# Patient Record
Sex: Male | Born: 2000 | Hispanic: No | Marital: Single | Smoking: Current every day smoker
Health system: Southern US, Community
[De-identification: ages and names within clinical notes are randomized; demographics above are authoritative.]

## PROBLEM LIST (undated history)

## (undated) DIAGNOSIS — W3400XA Accidental discharge from unspecified firearms or gun, initial encounter: Secondary | ICD-10-CM

---
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
## 2021-01-14 ENCOUNTER — Emergency Department (HOSPITAL_COMMUNITY): Payer: Medicaid Other | Admitting: Certified Registered Nurse Anesthetist

## 2021-01-14 ENCOUNTER — Encounter (HOSPITAL_COMMUNITY): Admission: EM | Disposition: A | Discharge status: Home / Self Care | Payer: Self-pay | Source: Home / Self Care

## 2021-01-14 ENCOUNTER — Emergency Department (HOSPITAL_BASED_OUTPATIENT_CLINIC_OR_DEPARTMENT_OTHER): Payer: Medicaid Other

## 2021-01-14 ENCOUNTER — Emergency Department (HOSPITAL_COMMUNITY): Payer: Medicaid Other

## 2021-01-14 ENCOUNTER — Inpatient Hospital Stay (HOSPITAL_BASED_OUTPATIENT_CLINIC_OR_DEPARTMENT_OTHER)
Admission: EM | Admit: 2021-01-14 | Discharge: 2021-01-18 | DRG: 957 | Disposition: A | Discharge status: Home / Self Care | Payer: Medicaid Other | Source: Ambulatory Visit | Attending: Surgery | Admitting: Surgery

## 2021-01-14 DIAGNOSIS — D62 Acute posthemorrhagic anemia: Secondary | ICD-10-CM | POA: Diagnosis present

## 2021-01-14 DIAGNOSIS — Z0189 Encounter for other specified special examinations: Secondary | ICD-10-CM

## 2021-01-14 DIAGNOSIS — Z9889 Other specified postprocedural states: Secondary | ICD-10-CM

## 2021-01-14 DIAGNOSIS — J90 Pleural effusion, not elsewhere classified: Secondary | ICD-10-CM

## 2021-01-14 DIAGNOSIS — S21132A Puncture wound without foreign body of left front wall of thorax without penetration into thoracic cavity, initial encounter: Secondary | ICD-10-CM | POA: Diagnosis present

## 2021-01-14 DIAGNOSIS — I959 Hypotension, unspecified: Secondary | ICD-10-CM | POA: Diagnosis present

## 2021-01-14 DIAGNOSIS — Z20822 Contact with and (suspected) exposure to covid-19: Secondary | ICD-10-CM | POA: Diagnosis present

## 2021-01-14 DIAGNOSIS — T07XXXA Unspecified multiple injuries, initial encounter: Secondary | ICD-10-CM

## 2021-01-14 DIAGNOSIS — J9601 Acute respiratory failure with hypoxia: Secondary | ICD-10-CM | POA: Diagnosis present

## 2021-01-14 DIAGNOSIS — E669 Obesity, unspecified: Secondary | ICD-10-CM | POA: Diagnosis present

## 2021-01-14 DIAGNOSIS — S27321A Contusion of lung, unilateral, initial encounter: Secondary | ICD-10-CM | POA: Diagnosis present

## 2021-01-14 DIAGNOSIS — E876 Hypokalemia: Secondary | ICD-10-CM | POA: Diagnosis not present

## 2021-01-14 DIAGNOSIS — W3400XA Accidental discharge from unspecified firearms or gun, initial encounter: Secondary | ICD-10-CM

## 2021-01-14 DIAGNOSIS — S71132A Puncture wound without foreign body, left thigh, initial encounter: Secondary | ICD-10-CM | POA: Diagnosis present

## 2021-01-14 DIAGNOSIS — Y929 Unspecified place or not applicable: Secondary | ICD-10-CM

## 2021-01-14 DIAGNOSIS — Z6832 Body mass index (BMI) 32.0-32.9, adult: Secondary | ICD-10-CM

## 2021-01-14 DIAGNOSIS — Z23 Encounter for immunization: Secondary | ICD-10-CM

## 2021-01-14 DIAGNOSIS — S31139A Puncture wound of abdominal wall without foreign body, unspecified quadrant without penetration into peritoneal cavity, initial encounter: Secondary | ICD-10-CM | POA: Diagnosis present

## 2021-01-14 DIAGNOSIS — S36032A Major laceration of spleen, initial encounter: Principal | ICD-10-CM | POA: Diagnosis present

## 2021-01-14 DIAGNOSIS — S31823A Puncture wound without foreign body of left buttock, initial encounter: Secondary | ICD-10-CM | POA: Diagnosis present

## 2021-01-14 DIAGNOSIS — T1490XA Injury, unspecified, initial encounter: Secondary | ICD-10-CM

## 2021-01-14 DIAGNOSIS — K661 Hemoperitoneum: Secondary | ICD-10-CM | POA: Diagnosis present

## 2021-01-14 DIAGNOSIS — S2242XB Multiple fractures of ribs, left side, initial encounter for open fracture: Secondary | ICD-10-CM | POA: Diagnosis present

## 2021-01-14 DIAGNOSIS — S27803A Laceration of diaphragm, initial encounter: Secondary | ICD-10-CM | POA: Diagnosis present

## 2021-01-14 DIAGNOSIS — S36039A Unspecified laceration of spleen, initial encounter: Secondary | ICD-10-CM

## 2021-01-14 DIAGNOSIS — S71131A Puncture wound without foreign body, right thigh, initial encounter: Secondary | ICD-10-CM | POA: Diagnosis present

## 2021-01-14 HISTORY — PX: LAPAROTOMY: SHX154

## 2021-01-14 HISTORY — PX: SPLENECTOMY, TOTAL: SHX788

## 2021-01-14 LAB — CBC WITH DIFFERENTIAL/PLATELET
Abs Immature Granulocytes: 0.04 10*3/uL (ref 0.00–0.07)
Basophils Absolute: 0 10*3/uL (ref 0.0–0.1)
Basophils Relative: 0 %
Eosinophils Absolute: 0 10*3/uL (ref 0.0–0.5)
Eosinophils Relative: 0 %
HCT: 41.7 % (ref 39.0–52.0)
Hemoglobin: 13.5 g/dL (ref 13.0–17.0)
Immature Granulocytes: 0 %
Lymphocytes Relative: 58 %
Lymphs Abs: 5.2 10*3/uL — ABNORMAL HIGH (ref 0.7–4.0)
MCH: 26.4 pg (ref 26.0–34.0)
MCHC: 32.4 g/dL (ref 30.0–36.0)
MCV: 81.4 fL (ref 80.0–100.0)
Monocytes Absolute: 1 10*3/uL (ref 0.1–1.0)
Monocytes Relative: 11 %
Neutro Abs: 2.9 10*3/uL (ref 1.7–7.7)
Neutrophils Relative %: 31 %
Platelets: 252 10*3/uL (ref 150–400)
RBC: 5.12 MIL/uL (ref 4.22–5.81)
RDW: 13.2 % (ref 11.5–15.5)
WBC: 9.2 10*3/uL (ref 4.0–10.5)
nRBC: 0 % (ref 0.0–0.2)

## 2021-01-14 SURGERY — SPLENECTOMY
Anesthesia: General | Site: Abdomen

## 2021-01-14 MED ORDER — FENTANYL CITRATE PF 50 MCG/ML IJ SOSY: 100.0000 ug | PREFILLED_SYRINGE | Freq: Once | INTRAMUSCULAR | Status: AC

## 2021-01-14 MED ORDER — ONDANSETRON 4 MG PO TBDP: 4.0000 mg | ORAL_TABLET | Freq: Once | ORAL | Status: DC

## 2021-01-14 MED ORDER — FENTANYL CITRATE PF 50 MCG/ML IJ SOSY
PREFILLED_SYRINGE | INTRAMUSCULAR | Status: AC
  Administered 2021-01-14: 100 ug via INTRAVENOUS
  Filled 2021-01-14: qty 1

## 2021-01-14 MED ORDER — FENTANYL CITRATE (PF) 250 MCG/5ML IJ SOLN
INTRAMUSCULAR | Status: AC
  Filled 2021-01-14: qty 5

## 2021-01-14 MED ORDER — IOHEXOL 350 MG/ML SOLN
100.0000 mL | Freq: Once | INTRAVENOUS | Status: AC | PRN
  Administered 2021-01-14: 100 mL via INTRAVENOUS

## 2021-01-14 MED ORDER — ONDANSETRON HCL 4 MG/2ML IJ SOLN
INTRAMUSCULAR | Status: AC
  Administered 2021-01-14: 4 mg
  Filled 2021-01-14: qty 2

## 2021-01-14 MED ORDER — PROPOFOL 10 MG/ML IV BOLUS
INTRAVENOUS | Status: AC
  Filled 2021-01-14: qty 20

## 2021-01-14 MED ORDER — CEFAZOLIN SODIUM-DEXTROSE 2-4 GM/100ML-% IV SOLN
2.0000 g | Freq: Once | INTRAVENOUS | Status: AC
  Administered 2021-01-14: 2 g via INTRAVENOUS

## 2021-01-14 MED ORDER — TETANUS-DIPHTH-ACELL PERTUSSIS 5-2.5-18.5 LF-MCG/0.5 IM SUSY
0.5000 mL | PREFILLED_SYRINGE | Freq: Once | INTRAMUSCULAR | Status: AC
  Administered 2021-01-14: 0.5 mL via INTRAMUSCULAR

## 2021-01-14 MED ORDER — SODIUM CHLORIDE 0.9 % IV SOLN
INTRAVENOUS | Status: AC | PRN
  Administered 2021-01-14: 1000 mL via INTRAVENOUS

## 2021-01-14 SURGICAL SUPPLY — 52 items
APL PRP STRL LF DISP 70% ISPRP (MISCELLANEOUS)
BAG COUNTER SPONGE SURGICOUNT (BAG) ×2 IMPLANT
BAG SPNG CNTER NS LX DISP (BAG) ×1
BLADE CLIPPER SURG (BLADE) ×1 IMPLANT
BNDG GAUZE ELAST 4 BULKY (GAUZE/BANDAGES/DRESSINGS) ×2 IMPLANT
CANISTER SUCT 3000ML PPV (MISCELLANEOUS) ×2 IMPLANT
CHLORAPREP W/TINT 26 (MISCELLANEOUS) ×1 IMPLANT
COVER SURGICAL LIGHT HANDLE (MISCELLANEOUS) ×2 IMPLANT
DRAPE LAPAROSCOPIC ABDOMINAL (DRAPES) ×2 IMPLANT
DRAPE WARM FLUID 44X44 (DRAPES) ×2 IMPLANT
ELECT BLADE 6.5 EXT (BLADE) IMPLANT
ELECT REM PT RETURN 9FT ADLT (ELECTROSURGICAL) ×2
ELECTRODE REM PT RTRN 9FT ADLT (ELECTROSURGICAL) ×1 IMPLANT
GAUZE SPONGE 4X4 12PLY STRL (GAUZE/BANDAGES/DRESSINGS) ×4 IMPLANT
GLOVE BIO SURGEON STRL SZ7 (GLOVE) ×2 IMPLANT
GLOVE BIOGEL PI IND STRL 7.5 (GLOVE) IMPLANT
GLOVE BIOGEL PI INDICATOR 7.5 (GLOVE) ×1
GLOVE OPTIFIT SS 7.5 STRL LX (GLOVE) ×2 IMPLANT
GLOVE OPTIFIT SS 8.0 STRL (GLOVE) ×1 IMPLANT
GLOVE SURG ENC MOIS LTX SZ7.5 (GLOVE) ×2 IMPLANT
GLOVE SURG UNDER LTX SZ8 (GLOVE) ×2 IMPLANT
GOWN STRL REUS W/ TWL LRG LVL3 (GOWN DISPOSABLE) ×1 IMPLANT
GOWN STRL REUS W/ TWL XL LVL3 (GOWN DISPOSABLE) ×1 IMPLANT
GOWN STRL REUS W/TWL LRG LVL3 (GOWN DISPOSABLE) ×2
GOWN STRL REUS W/TWL XL LVL3 (GOWN DISPOSABLE) ×2
HANDLE SUCTION POOLE (INSTRUMENTS) ×1 IMPLANT
KIT BASIN OR (CUSTOM PROCEDURE TRAY) ×2 IMPLANT
KIT TURNOVER KIT B (KITS) ×2 IMPLANT
LIGASURE IMPACT 36 18CM CVD LR (INSTRUMENTS) ×1 IMPLANT
NS IRRIG 1000ML POUR BTL (IV SOLUTION) ×6 IMPLANT
PACK GENERAL/GYN (CUSTOM PROCEDURE TRAY) ×2 IMPLANT
PAD ARMBOARD 7.5X6 YLW CONV (MISCELLANEOUS) ×2 IMPLANT
PENCIL SMOKE EVACUATOR (MISCELLANEOUS) ×2 IMPLANT
RELOAD STAPLE 60 2.6 WHT THN (STAPLE) IMPLANT
RELOAD STAPLER WHITE 60MM (STAPLE) ×4 IMPLANT
SOL PREP PROV IODINE SCRUB 4OZ (MISCELLANEOUS) ×1 IMPLANT
SPECIMEN JAR LARGE (MISCELLANEOUS) IMPLANT
SPONGE T-LAP 18X18 ~~LOC~~+RFID (SPONGE) ×7 IMPLANT
STAPLE ECHEON FLEX 60 POW ENDO (STAPLE) ×1 IMPLANT
STAPLER RELOAD WHITE 60MM (STAPLE) ×8
STAPLER VISISTAT 35W (STAPLE) ×2 IMPLANT
SUCTION POOLE HANDLE (INSTRUMENTS) ×2
SUT ETHILON 2 0 FS 18 (SUTURE) ×1 IMPLANT
SUT PDS AB 0 CT1 27 (SUTURE) ×1 IMPLANT
SUT PDS AB 1 TP1 96 (SUTURE) ×4 IMPLANT
SUT SILK 2 0 SH CR/8 (SUTURE) ×2 IMPLANT
SUT SILK 2 0 TIES 10X30 (SUTURE) ×2 IMPLANT
SUT SILK 3 0 SH CR/8 (SUTURE) ×2 IMPLANT
SUT SILK 3 0 TIES 10X30 (SUTURE) ×2 IMPLANT
TOWEL GREEN STERILE (TOWEL DISPOSABLE) ×2 IMPLANT
TRAY FOLEY MTR SLVR 16FR STAT (SET/KITS/TRAYS/PACK) ×2 IMPLANT
YANKAUER SUCT BULB TIP NO VENT (SUCTIONS) ×1 IMPLANT

## 2021-01-14 NOTE — ED Notes (Signed)
[name] in OR to call when ready for patient.

## 2021-01-14 NOTE — ED Notes (Signed)
Pt on stretcher-moaning when moved-pt made aware by EDP that he is being transferred to [facility] ED

## 2021-01-14 NOTE — ED Notes (Signed)
[facility] here for transport 
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
## 2021-01-14 NOTE — ED Notes (Signed)
Returned from ct 

## 2021-01-14 NOTE — ED Notes (Signed)
Labs ordered unable to be drawn d/t infusing blood products on arrival to the ED.

## 2021-01-14 NOTE — ED Notes (Signed)
Or is ready

## 2021-01-14 NOTE — ED Notes (Signed)
Asked pt if he would like [place] to call and updated, at this time, he says his "girl knows" and does not request any further status update

## 2021-01-14 NOTE — ED Notes (Signed)
Radiology in for xrays

## 2021-01-14 NOTE — ED Notes (Signed)
Pt given fentanyl 100mg  and zofran 4mg  IV-pt has PRBC x 2 units infusing
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
## 2021-01-15 ENCOUNTER — Encounter (HOSPITAL_COMMUNITY): Payer: Self-pay | Admitting: Surgery

## 2021-01-15 ENCOUNTER — Inpatient Hospital Stay (HOSPITAL_COMMUNITY): Payer: Medicaid Other

## 2021-01-15 DIAGNOSIS — S36039A Unspecified laceration of spleen, initial encounter: Secondary | ICD-10-CM | POA: Diagnosis present

## 2021-01-15 DIAGNOSIS — W3400XA Accidental discharge from unspecified firearms or gun, initial encounter: Secondary | ICD-10-CM

## 2021-01-15 DIAGNOSIS — S2242XB Multiple fractures of ribs, left side, initial encounter for open fracture: Secondary | ICD-10-CM | POA: Diagnosis present

## 2021-01-15 DIAGNOSIS — S31823A Puncture wound without foreign body of left buttock, initial encounter: Secondary | ICD-10-CM | POA: Diagnosis present

## 2021-01-15 DIAGNOSIS — E669 Obesity, unspecified: Secondary | ICD-10-CM | POA: Diagnosis present

## 2021-01-15 DIAGNOSIS — S21132A Puncture wound without foreign body of left front wall of thorax without penetration into thoracic cavity, initial encounter: Secondary | ICD-10-CM | POA: Diagnosis present

## 2021-01-15 DIAGNOSIS — S27803A Laceration of diaphragm, initial encounter: Secondary | ICD-10-CM | POA: Diagnosis present

## 2021-01-15 DIAGNOSIS — E876 Hypokalemia: Secondary | ICD-10-CM | POA: Diagnosis not present

## 2021-01-15 DIAGNOSIS — Z20822 Contact with and (suspected) exposure to covid-19: Secondary | ICD-10-CM | POA: Diagnosis present

## 2021-01-15 DIAGNOSIS — D62 Acute posthemorrhagic anemia: Secondary | ICD-10-CM | POA: Diagnosis present

## 2021-01-15 DIAGNOSIS — S31139A Puncture wound of abdominal wall without foreign body, unspecified quadrant without penetration into peritoneal cavity, initial encounter: Secondary | ICD-10-CM | POA: Diagnosis present

## 2021-01-15 DIAGNOSIS — Z9889 Other specified postprocedural states: Secondary | ICD-10-CM

## 2021-01-15 DIAGNOSIS — I959 Hypotension, unspecified: Secondary | ICD-10-CM | POA: Diagnosis present

## 2021-01-15 DIAGNOSIS — J9601 Acute respiratory failure with hypoxia: Secondary | ICD-10-CM | POA: Diagnosis present

## 2021-01-15 DIAGNOSIS — S71132A Puncture wound without foreign body, left thigh, initial encounter: Secondary | ICD-10-CM | POA: Diagnosis present

## 2021-01-15 DIAGNOSIS — Z23 Encounter for immunization: Secondary | ICD-10-CM | POA: Diagnosis not present

## 2021-01-15 DIAGNOSIS — K661 Hemoperitoneum: Secondary | ICD-10-CM | POA: Diagnosis present

## 2021-01-15 DIAGNOSIS — S27321A Contusion of lung, unilateral, initial encounter: Secondary | ICD-10-CM | POA: Diagnosis present

## 2021-01-15 DIAGNOSIS — Y929 Unspecified place or not applicable: Secondary | ICD-10-CM | POA: Diagnosis not present

## 2021-01-15 DIAGNOSIS — Z6832 Body mass index (BMI) 32.0-32.9, adult: Secondary | ICD-10-CM | POA: Diagnosis not present

## 2021-01-15 DIAGNOSIS — S71131A Puncture wound without foreign body, right thigh, initial encounter: Secondary | ICD-10-CM | POA: Diagnosis present

## 2021-01-15 DIAGNOSIS — S36032A Major laceration of spleen, initial encounter: Secondary | ICD-10-CM | POA: Diagnosis present

## 2021-01-15 LAB — URINALYSIS, ROUTINE W REFLEX MICROSCOPIC
Bacteria, UA: NONE SEEN
Bilirubin Urine: NEGATIVE
Glucose, UA: NEGATIVE mg/dL
Hgb urine dipstick: NEGATIVE
Ketones, ur: 80 mg/dL — AB
Leukocytes,Ua: NEGATIVE
Nitrite: NEGATIVE
Protein, ur: 30 mg/dL — AB
Specific Gravity, Urine: >1.046 — ABNORMAL HIGH (ref 1.005–1.030)
pH: 5 (ref 5.0–8.0)

## 2021-01-15 LAB — CBC
HCT: 38.5 % — ABNORMAL LOW (ref 39.0–52.0)
HCT: 38.3 % — ABNORMAL LOW (ref 39.0–52.0)
Hemoglobin: 13.2 g/dL (ref 13.0–17.0)
Hemoglobin: 13 g/dL (ref 13.0–17.0)
MCH: 27.6 pg (ref 26.0–34.0)
MCH: 27.4 pg (ref 26.0–34.0)
MCHC: 34.3 g/dL (ref 30.0–36.0)
MCHC: 33.9 g/dL (ref 30.0–36.0)
MCV: 80.4 fL (ref 80.0–100.0)
MCV: 80.6 fL (ref 80.0–100.0)
Platelets: 265 10*3/uL (ref 150–400)
Platelets: 278 10*3/uL (ref 150–400)
RBC: 4.79 MIL/uL (ref 4.22–5.81)
RBC: 4.75 MIL/uL (ref 4.22–5.81)
RDW: 13.8 % (ref 11.5–15.5)
RDW: 13.9 % (ref 11.5–15.5)
WBC: 10.9 10*3/uL — ABNORMAL HIGH (ref 4.0–10.5)
WBC: 15 10*3/uL — ABNORMAL HIGH (ref 4.0–10.5)
nRBC: 0 % (ref 0.0–0.2)
nRBC: 0 % (ref 0.0–0.2)

## 2021-01-15 LAB — BPAM RBC
Blood Product Expiration Date: 202210122359
Blood Product Expiration Date: 202210132359
ISSUE DATE / TIME: 202209262223
ISSUE DATE / TIME: 202209262223
Unit Type and Rh: 9500
Unit Type and Rh: 9500

## 2021-01-15 LAB — BASIC METABOLIC PANEL
Anion gap: 11 (ref 5–15)
BUN: 10 mg/dL (ref 6–20)
CO2: 21 mmol/L — ABNORMAL LOW (ref 22–32)
Calcium: 8 mg/dL — ABNORMAL LOW (ref 8.9–10.3)
Chloride: 103 mmol/L (ref 98–111)
Creatinine, Ser: 0.92 mg/dL (ref 0.61–1.24)
GFR, Estimated: >60 mL/min (ref >60)
Glucose, Bld: 151 mg/dL — ABNORMAL HIGH (ref 70–99)
Potassium: 3.9 mmol/L (ref 3.5–5.1)
Sodium: 135 mmol/L (ref 135–145)

## 2021-01-15 LAB — BLOOD PRODUCT ORDER (VERBAL) VERIFICATION

## 2021-01-15 LAB — COMPREHENSIVE METABOLIC PANEL
ALT: 25 U/L (ref 0–44)
AST: 32 U/L (ref 15–41)
Albumin: 3.9 g/dL (ref 3.5–5.0)
Alkaline Phosphatase: 91 U/L (ref 38–126)
Anion gap: 23 — ABNORMAL HIGH (ref 5–15)
BUN: 12 mg/dL (ref 6–20)
CO2: 14 mmol/L — ABNORMAL LOW (ref 22–32)
Calcium: 8.5 mg/dL — ABNORMAL LOW (ref 8.9–10.3)
Chloride: 98 mmol/L (ref 98–111)
Creatinine, Ser: 1.41 mg/dL — ABNORMAL HIGH (ref 0.61–1.24)
GFR, Estimated: >60 mL/min (ref >60)
Glucose, Bld: 236 mg/dL — ABNORMAL HIGH (ref 70–99)
Potassium: 2.6 mmol/L — CL (ref 3.5–5.1)
Sodium: 135 mmol/L (ref 135–145)
Total Bilirubin: 0.4 mg/dL (ref 0.3–1.2)
Total Protein: 7.5 g/dL (ref 6.5–8.1)

## 2021-01-15 LAB — RESP PANEL BY RT-PCR (FLU A&B, COVID) ARPGX2
Influenza A by PCR: NEGATIVE
Influenza B by PCR: NEGATIVE
SARS Coronavirus 2 by RT PCR: NEGATIVE

## 2021-01-15 LAB — POCT I-STAT 7, (LYTES, BLD GAS, ICA,H+H)
Acid-base deficit: 2 mmol/L (ref 0.0–2.0)
Bicarbonate: 25.9 mmol/L (ref 20.0–28.0)
Calcium, Ion: 1.11 mmol/L — ABNORMAL LOW (ref 1.15–1.40)
HCT: 34 % — ABNORMAL LOW (ref 39.0–52.0)
Hemoglobin: 11.6 g/dL — ABNORMAL LOW (ref 13.0–17.0)
O2 Saturation: 100 %
Patient temperature: 35.9
Potassium: 3.4 mmol/L — ABNORMAL LOW (ref 3.5–5.1)
Sodium: 137 mmol/L (ref 135–145)
TCO2: 28 mmol/L (ref 22–32)
pCO2 arterial: 52.5 mmHg — ABNORMAL HIGH (ref 32.0–48.0)
pH, Arterial: 7.296 — ABNORMAL LOW (ref 7.350–7.450)
pO2, Arterial: 253 mmHg — ABNORMAL HIGH (ref 83.0–108.0)

## 2021-01-15 LAB — TYPE AND SCREEN
ABO/RH(D): A POS
Antibody Screen: NEGATIVE
Unit division: 0
Unit division: 0

## 2021-01-15 LAB — ETHANOL: Alcohol, Ethyl (B): <10 mg/dL (ref <10)

## 2021-01-15 LAB — HIV ANTIBODY (ROUTINE TESTING W REFLEX): HIV Screen 4th Generation wRfx: NONREACTIVE

## 2021-01-15 LAB — MRSA NEXT GEN BY PCR, NASAL: MRSA by PCR Next Gen: NOT DETECTED

## 2021-01-15 LAB — PREPARE RBC (CROSSMATCH)

## 2021-01-15 LAB — ABO/RH: ABO/RH(D): A POS

## 2021-01-15 MED ORDER — FENTANYL CITRATE (PF) 250 MCG/5ML IJ SOLN
INTRAMUSCULAR | Status: DC | PRN
  Administered 2021-01-14: 50 ug via INTRAVENOUS
  Administered 2021-01-15 (×2): 100 ug via INTRAVENOUS

## 2021-01-15 MED ORDER — LIDOCAINE HCL (PF) 2 % IJ SOLN
INTRAMUSCULAR | Status: AC
  Filled 2021-01-15: qty 10

## 2021-01-15 MED ORDER — SUCCINYLCHOLINE CHLORIDE 200 MG/10ML IV SOSY
PREFILLED_SYRINGE | INTRAVENOUS | Status: AC
  Filled 2021-01-15: qty 10

## 2021-01-15 MED ORDER — ROCURONIUM BROMIDE 100 MG/10ML IV SOLN
INTRAVENOUS | Status: DC | PRN
  Administered 2021-01-15 (×3): 50 mg via INTRAVENOUS

## 2021-01-15 MED ORDER — CHLORHEXIDINE GLUCONATE CLOTH 2 % EX PADS
6.0000 | MEDICATED_PAD | Freq: Every day | CUTANEOUS | Status: DC
  Administered 2021-01-17: 6 via TOPICAL

## 2021-01-15 MED ORDER — PROPOFOL 500 MG/50ML IV EMUL
INTRAVENOUS | Status: DC | PRN
  Administered 2021-01-15: 50 ug/kg/min via INTRAVENOUS

## 2021-01-15 MED ORDER — LIDOCAINE 2% (20 MG/ML) 5 ML SYRINGE
INTRAMUSCULAR | Status: DC | PRN
  Administered 2021-01-14: 50 mg via INTRAVENOUS

## 2021-01-15 MED ORDER — DOCUSATE SODIUM 100 MG PO CAPS: 100.0000 mg | ORAL_CAPSULE | Freq: Two times a day (BID) | ORAL | Status: DC

## 2021-01-15 MED ORDER — DEXAMETHASONE SODIUM PHOSPHATE 10 MG/ML IJ SOLN
INTRAMUSCULAR | Status: AC
  Filled 2021-01-15: qty 1

## 2021-01-15 MED ORDER — FENTANYL BOLUS VIA INFUSION
50.0000 ug | INTRAVENOUS | Status: DC | PRN
  Administered 2021-01-15 (×3): 50 ug via INTRAVENOUS
  Administered 2021-01-15: 100 ug via INTRAVENOUS
  Filled 2021-01-15: qty 100

## 2021-01-15 MED ORDER — DOCUSATE SODIUM 50 MG/5ML PO LIQD: 100.0000 mg | Freq: Two times a day (BID) | ORAL | Status: DC

## 2021-01-15 MED ORDER — SUCCINYLCHOLINE CHLORIDE 200 MG/10ML IV SOSY
PREFILLED_SYRINGE | INTRAVENOUS | Status: DC | PRN
  Administered 2021-01-14: 140 mg via INTRAVENOUS

## 2021-01-15 MED ORDER — ROCURONIUM BROMIDE 10 MG/ML (PF) SYRINGE
PREFILLED_SYRINGE | INTRAVENOUS | Status: AC
  Filled 2021-01-15: qty 60

## 2021-01-15 MED ORDER — PHENYLEPHRINE 40 MCG/ML (10ML) SYRINGE FOR IV PUSH (FOR BLOOD PRESSURE SUPPORT)
PREFILLED_SYRINGE | INTRAVENOUS | Status: AC
  Filled 2021-01-15: qty 20

## 2021-01-15 MED ORDER — LACTATED RINGERS IV BOLUS
1000.0000 mL | Freq: Once | INTRAVENOUS | Status: AC
  Administered 2021-01-15: 1000 mL via INTRAVENOUS

## 2021-01-15 MED ORDER — FENTANYL CITRATE PF 50 MCG/ML IJ SOSY
50.0000 ug | PREFILLED_SYRINGE | INTRAMUSCULAR | Status: DC | PRN
Start: 2021-01-15 — End: 2021-01-16
  Administered 2021-01-15 (×4): 100 ug via INTRAVENOUS
  Administered 2021-01-15: 50 ug via INTRAVENOUS
  Administered 2021-01-16: 100 ug via INTRAVENOUS
  Filled 2021-01-15: qty 2
  Filled 2021-01-15: qty 1
  Filled 2021-01-15 (×4): qty 2

## 2021-01-15 MED ORDER — FENTANYL CITRATE PF 50 MCG/ML IJ SOSY
50.0000 ug | PREFILLED_SYRINGE | Freq: Once | INTRAMUSCULAR | Status: AC
  Administered 2021-01-15: 50 ug via INTRAVENOUS

## 2021-01-15 MED ORDER — HYDROMORPHONE HCL 1 MG/ML IJ SOLN
INTRAMUSCULAR | Status: AC
  Filled 2021-01-15: qty 0.5

## 2021-01-15 MED ORDER — ONDANSETRON HCL 4 MG/2ML IJ SOLN
4.0000 mg | Freq: Four times a day (QID) | INTRAMUSCULAR | Status: DC | PRN
  Administered 2021-01-15 – 2021-01-16 (×4): 4 mg via INTRAVENOUS
  Filled 2021-01-15 (×4): qty 2

## 2021-01-15 MED ORDER — LACTATED RINGERS IV SOLN: INTRAVENOUS | Status: DC | PRN

## 2021-01-15 MED ORDER — ONDANSETRON 4 MG PO TBDP
4.0000 mg | ORAL_TABLET | Freq: Four times a day (QID) | ORAL | Status: DC | PRN
  Administered 2021-01-15 – 2021-01-17 (×2): 4 mg via ORAL
  Filled 2021-01-15 (×2): qty 1

## 2021-01-15 MED ORDER — EPHEDRINE 5 MG/ML INJ
INTRAVENOUS | Status: AC
  Filled 2021-01-15: qty 10

## 2021-01-15 MED ORDER — PHENYLEPHRINE HCL-NACL 20-0.9 MG/250ML-% IV SOLN
INTRAVENOUS | Status: DC | PRN
  Administered 2021-01-15: 10 ug/min via INTRAVENOUS

## 2021-01-15 MED ORDER — ENOXAPARIN SODIUM 30 MG/0.3ML IJ SOSY
30.0000 mg | PREFILLED_SYRINGE | Freq: Two times a day (BID) | INTRAMUSCULAR | Status: DC
  Administered 2021-01-16 – 2021-01-18 (×5): 30 mg via SUBCUTANEOUS
  Filled 2021-01-15 (×5): qty 0.3

## 2021-01-15 MED ORDER — PROPOFOL 1000 MG/100ML IV EMUL
0.0000 ug/kg/min | INTRAVENOUS | Status: DC
Start: 2021-01-15 — End: 2021-01-15
  Administered 2021-01-15: 40 ug/kg/min via INTRAVENOUS
  Filled 2021-01-15 (×2): qty 100

## 2021-01-15 MED ORDER — ONDANSETRON HCL 4 MG/2ML IJ SOLN
INTRAMUSCULAR | Status: AC
  Filled 2021-01-15: qty 4

## 2021-01-15 MED ORDER — FENTANYL 2500MCG IN NS 250ML (10MCG/ML) PREMIX INFUSION
50.0000 ug/h | INTRAVENOUS | Status: DC
  Administered 2021-01-15: 50 ug/h via INTRAVENOUS
  Filled 2021-01-15: qty 250

## 2021-01-15 MED ORDER — ATROPINE SULFATE 0.4 MG/ML IJ SOLN
INTRAMUSCULAR | Status: AC
  Filled 2021-01-15: qty 2

## 2021-01-15 MED ORDER — POLYETHYLENE GLYCOL 3350 17 G PO PACK: 17.0000 g | PACK | Freq: Every day | ORAL | Status: DC

## 2021-01-15 MED ORDER — PHENYLEPHRINE HCL (PRESSORS) 10 MG/ML IV SOLN
INTRAVENOUS | Status: DC | PRN
  Administered 2021-01-15 (×3): 80 ug via INTRAVENOUS

## 2021-01-15 MED ORDER — 0.9 % SODIUM CHLORIDE (POUR BTL) OPTIME
TOPICAL | Status: DC | PRN
  Administered 2021-01-15: 4000 mL

## 2021-01-15 MED ORDER — PROPOFOL 10 MG/ML IV BOLUS
INTRAVENOUS | Status: DC | PRN
Start: 2021-01-14 — End: 2021-01-15
  Administered 2021-01-14: 140 mg via INTRAVENOUS

## 2021-01-15 MED ORDER — HYDROMORPHONE HCL 1 MG/ML IJ SOLN
INTRAMUSCULAR | Status: DC | PRN
  Administered 2021-01-15: .5 mg via INTRAVENOUS

## 2021-01-15 MED ORDER — LACTATED RINGERS IV SOLN: INTRAVENOUS | Status: DC

## 2021-01-15 MED ORDER — OXYCODONE HCL 5 MG PO TABS
5.0000 mg | ORAL_TABLET | ORAL | Status: DC | PRN
  Administered 2021-01-15 – 2021-01-17 (×8): 10 mg via ORAL
  Filled 2021-01-15 (×8): qty 2

## 2021-01-15 NOTE — Progress Notes (Signed)
Patient ID: Mark Grimes, male   DOB: [birth year], 19 y.o.   MRN: [id number] RN reports he now wants to see his mother - update visitation.  Violeta Gelinas, MD, MPH, FACS Please use [email or web address] to contact on call provider

## 2021-01-15 NOTE — TOC CAGE-AID Note (Signed)
Transition of Care [facility]) - CAGE-AID Screening   Patient Details  Name: Mark Grimes: [id number] Date of Birth: 2001/01/02  Transition of Care [facility] CM/SW Contact:    Annelies Coyt C Tarpley-Carter, LCSWA Phone Number: 01/15/2021, 3:09 PM   Clinical Narrative: Pt participated in Cage-Aid.  Pt stated he does use substance (cannabis) and ETOH socially.  Neither effects his day to day life.  Pt was offered resources, and accepted.     Zakara Parkey Tarpley-Carter, MSW, LCSW-A Pronouns:  She/Her/Hers [facility] Clinical Social Worker Direct Number:  [phone] [email or web address]   CAGE-AID Screening:    Have You Ever Felt You Ought to Cut Down on Your Drinking or Drug Use?: No Have People Annoyed You By [employer] Your Drinking Or Drug Use?: No Have You Felt Bad Or Guilty About Your Drinking Or Drug Use?: No Have You Ever Had a Drink or Used Drugs First Thing In The Morning to Steady Your Nerves or to Get Rid of a Hangover?: No CAGE-AID Score: 0  Substance Abuse Education Offered: Yes  Substance abuse interventions: Transport planner
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
## 2021-01-15 NOTE — Anesthesia Procedure Notes (Addendum)
Arterial Line Insertion Start/End9/27/2022 12:03 AM, 01/15/2021 12:07 AM Performed by: Shelton Silvas, MD, Molli Hazard, CRNA, CRNA  Patient location: OR. Preanesthetic checklist: patient identified, IV checked, site marked, risks and benefits discussed, surgical consent, monitors and equipment checked, pre-op evaluation, timeout performed and anesthesia consent Left, radial was placed Catheter size: 20 G Hand hygiene performed  and maximum sterile barriers used   Attempts: 1 Procedure performed without using ultrasound guided technique. Following insertion, dressing applied and Biopatch. Post procedure assessment: normal and unchanged  Patient tolerated the procedure well with no immediate complications.

## 2021-01-15 NOTE — Progress Notes (Signed)
Patient ID: Mark Grimes, male   DOB: [birth year], 19 y.o.   MRN: [id number] He is doing well after extubation. I updated his mother at the bedside.  Violeta Gelinas, MD, MPH, FACS Please use [email or web address] to contact on call provider

## 2021-01-15 NOTE — Procedures (Signed)
Extubation Procedure Note  Patient Details:   Name: Mark Grimes DOB: 2000/10/19 MRN: [id number]   Airway Documentation:    Vent end date: 01/15/21 Vent end time: 0912   Evaluation  O2 sats: stable throughout Complications: No apparent complications Patient did tolerate procedure well. Bilateral Breath Sounds: Rhonchi   Yes  Pt extubated to 4L [place] per MD order. Pt had positive cuff leak, no stridor heard after. RN at bedside. RT will continue to monitor.   [name] 01/15/2021, 9:16 AM

## 2021-01-15 NOTE — Anesthesia Procedure Notes (Addendum)
Procedure Name: Intubation Date/Time: 01/14/2021 11:58 PM Performed by: Molli Hazard, CRNA Pre-anesthesia Checklist: Patient identified, Emergency Drugs available, Suction available and Patient being monitored Patient Re-evaluated:Patient Re-evaluated prior to induction Oxygen Delivery Method: Circle System Utilized Preoxygenation: Pre-oxygenation with 100% oxygen Induction Type: IV induction Laryngoscope Size: Miller and 2 Grade View: Grade I Tube type: Oral Tube size: 7.5 mm Number of attempts: 1 Airway Equipment and Method: Stylet and Oral airway Placement Confirmation: ETT inserted through vocal cords under direct vision, positive ETCO2 and breath sounds checked- equal and bilateral Secured at: 23 cm Tube secured with: Tape Dental Injury: Teeth and Oropharynx as per pre-operative assessment

## 2021-01-15 NOTE — Transfer of Care (Signed)
Immediate Anesthesia Transfer of Care Note  Patient: Mark Grimes  Procedure(s) Performed: EXPLORATORY LAPAROTOMY (Abdomen) SPLENECTOMY AND  REPAIR OF DIAPHRAGM (Abdomen)  Patient Location: NICU  Anesthesia Type:General  Level of Consciousness: Patient remains intubated per anesthesia plan  Airway & Oxygen Therapy: Patient placed on Ventilator (see vital sign flow sheet for setting)  Post-op Assessment: Report given to RN and Post -op Vital signs reviewed and stable  Post vital signs: Reviewed and stable  Last Vitals:  Vitals Value Taken Time  BP 146/109 01/15/21 0200  Temp    Pulse 103 01/15/21 0202  Resp 16 01/15/21 0202  SpO2 100 % 01/15/21 0202  Vitals shown include unvalidated device data.  Last Pain:  Vitals:   01/14/21 2308  TempSrc: Temporal         Complications: No notable events documented.
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
## 2021-01-15 NOTE — Progress Notes (Signed)
Orthopedic Tech Progress Note Patient Details:  [name] May 17, 2000 [id number]  Patient ID: Mark Grimes, male   DOB: 2000/06/24, 19 y.o.   MRN: [id number] Attended trauma page Trinna Post 01/15/2021, 1:26 AM

## 2021-01-15 NOTE — Anesthesia Preprocedure Evaluation (Addendum)
Anesthesia Evaluation  Patient identified by MRN, date of birth, ID band Patient awake    Reviewed: Allergy & Precautions, NPO status , Patient's Chart, lab work & pertinent test results, Unable to perform ROS - Chart review onlyPreop documentation limited or incomplete due to emergent nature of procedure.  Airway Mallampati: I  TM Distance: >3 FB Neck ROM: Full    Dental  (+) Teeth Intact, Dental Advisory Given   Pulmonary neg pulmonary ROS,    breath sounds clear to auscultation       Cardiovascular negative cardio ROS   Rhythm:Regular Rate:Tachycardia     Neuro/Psych negative neurological ROS  negative psych ROS   GI/Hepatic negative GI ROS, Neg liver ROS,   Endo/Other  negative endocrine ROS  Renal/GU negative Renal ROS     Musculoskeletal negative musculoskeletal ROS (+)   Abdominal (+)  Abdomen: tender.    Peds  Hematology negative hematology ROS (+)   Anesthesia Other Findings   Reproductive/Obstetrics                             Anesthesia Physical Anesthesia Plan  ASA: 3 and emergent  Anesthesia Plan: General   Post-op Pain Management:    Induction: Intravenous, Rapid sequence and Cricoid pressure planned  PONV Risk Score and Plan: 3 and Ondansetron, Dexamethasone and Midazolam  Airway Management Planned: Oral ETT  Additional Equipment: Arterial line  Intra-op Plan:   Post-operative Plan: Post-operative intubation/ventilation  Informed Consent: I have reviewed the patients History and Physical, chart, labs and discussed the procedure including the risks, benefits and alternatives for the proposed anesthesia with the patient or authorized representative who has indicated his/her understanding and acceptance.     Dental advisory given  Plan Discussed with: CRNA  Anesthesia Plan Comments: (Surgeon request the patient stay intubated after procedure. )         Anesthesia Quick Evaluation

## 2021-01-15 NOTE — ED Notes (Signed)
Pt family information updated in chart

## 2021-01-16 LAB — BASIC METABOLIC PANEL
Anion gap: 9 (ref 5–15)
BUN: <5 mg/dL — ABNORMAL LOW (ref 6–20)
CO2: 26 mmol/L (ref 22–32)
Calcium: 7.9 mg/dL — ABNORMAL LOW (ref 8.9–10.3)
Chloride: 99 mmol/L (ref 98–111)
Creatinine, Ser: 0.72 mg/dL (ref 0.61–1.24)
GFR, Estimated: >60 mL/min (ref >60)
Glucose, Bld: 120 mg/dL — ABNORMAL HIGH (ref 70–99)
Potassium: 3.3 mmol/L — ABNORMAL LOW (ref 3.5–5.1)
Sodium: 134 mmol/L — ABNORMAL LOW (ref 135–145)

## 2021-01-16 LAB — CBC
HCT: 28.8 % — ABNORMAL LOW (ref 39.0–52.0)
HCT: 29.6 % — ABNORMAL LOW (ref 39.0–52.0)
Hemoglobin: 9.7 g/dL — ABNORMAL LOW (ref 13.0–17.0)
Hemoglobin: 10 g/dL — ABNORMAL LOW (ref 13.0–17.0)
MCH: 27 pg (ref 26.0–34.0)
MCH: 27.3 pg (ref 26.0–34.0)
MCHC: 33.7 g/dL (ref 30.0–36.0)
MCHC: 33.8 g/dL (ref 30.0–36.0)
MCV: 80.2 fL (ref 80.0–100.0)
MCV: 80.9 fL (ref 80.0–100.0)
Platelets: 278 10*3/uL (ref 150–400)
Platelets: 313 10*3/uL (ref 150–400)
RBC: 3.59 MIL/uL — ABNORMAL LOW (ref 4.22–5.81)
RBC: 3.66 MIL/uL — ABNORMAL LOW (ref 4.22–5.81)
RDW: 13.8 % (ref 11.5–15.5)
RDW: 13.8 % (ref 11.5–15.5)
WBC: 13.5 10*3/uL — ABNORMAL HIGH (ref 4.0–10.5)
WBC: 14.8 10*3/uL — ABNORMAL HIGH (ref 4.0–10.5)
nRBC: 0 % (ref 0.0–0.2)
nRBC: 0 % (ref 0.0–0.2)

## 2021-01-16 LAB — TRIGLYCERIDES: Triglycerides: 65 mg/dL (ref <150)

## 2021-01-16 LAB — SURGICAL PATHOLOGY

## 2021-01-16 MED ORDER — METHOCARBAMOL 500 MG PO TABS
500.0000 mg | ORAL_TABLET | Freq: Three times a day (TID) | ORAL | Status: DC
  Administered 2021-01-16 – 2021-01-18 (×6): 500 mg via ORAL
  Filled 2021-01-16 (×6): qty 1

## 2021-01-16 MED ORDER — POTASSIUM CHLORIDE 20 MEQ PO PACK
20.0000 meq | PACK | Freq: Once | ORAL | Status: AC
  Administered 2021-01-16: 20 meq via ORAL
  Filled 2021-01-16: qty 1

## 2021-01-16 MED ORDER — HYDROMORPHONE HCL 1 MG/ML IJ SOLN
1.0000 mg | INTRAMUSCULAR | Status: DC | PRN
Start: 2021-01-16 — End: 2021-01-18

## 2021-01-16 MED ORDER — METHOCARBAMOL 1000 MG/10ML IJ SOLN
500.0000 mg | Freq: Three times a day (TID) | INTRAVENOUS | Status: DC
  Administered 2021-01-16: 500 mg via INTRAVENOUS
  Filled 2021-01-16: qty 5

## 2021-01-16 MED ORDER — POLYETHYLENE GLYCOL 3350 17 G PO PACK
17.0000 g | PACK | Freq: Every day | ORAL | Status: DC
  Administered 2021-01-17 – 2021-01-18 (×2): 17 g via ORAL
  Filled 2021-01-16 (×2): qty 1

## 2021-01-16 MED ORDER — WHITE PETROLATUM EX OINT
TOPICAL_OINTMENT | CUTANEOUS | Status: AC
  Administered 2021-01-16: 0.2
  Filled 2021-01-16: qty 28.35

## 2021-01-16 MED ORDER — GABAPENTIN 300 MG PO CAPS
300.0000 mg | ORAL_CAPSULE | Freq: Three times a day (TID) | ORAL | Status: DC
  Administered 2021-01-16 – 2021-01-18 (×7): 300 mg via ORAL
  Filled 2021-01-16 (×7): qty 1

## 2021-01-16 MED ORDER — OXYCODONE HCL 5 MG PO TABS
10.0000 mg | ORAL_TABLET | Freq: Once | ORAL | Status: AC
Start: 2021-01-16 — End: 2021-01-16
  Administered 2021-01-16: 10 mg via ORAL
  Filled 2021-01-16: qty 2

## 2021-01-16 MED ORDER — ACETAMINOPHEN 500 MG PO TABS
1000.0000 mg | ORAL_TABLET | Freq: Four times a day (QID) | ORAL | Status: DC
  Administered 2021-01-16 – 2021-01-17 (×5): 1000 mg via ORAL
  Filled 2021-01-16 (×4): qty 2
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
## 2021-01-16 NOTE — Anesthesia Postprocedure Evaluation (Signed)
Anesthesia Post Note  Patient: Mark Grimes  Procedure(s) Performed: EXPLORATORY LAPAROTOMY (Abdomen) SPLENECTOMY AND  REPAIR OF DIAPHRAGM (Abdomen)     Patient location during evaluation: SICU Anesthesia Type: General Level of consciousness: sedated Pain management: pain level controlled Vital Signs Assessment: post-procedure vital signs reviewed and stable Respiratory status: patient remains intubated per anesthesia plan Cardiovascular status: stable Postop Assessment: no apparent nausea or vomiting Anesthetic complications: no   No notable events documented.  Last Vitals:  Vitals:   01/16/21 0500 01/16/21 0600  BP: 121/78 (!) 152/83  Pulse: 80 87  Resp: 13 (!) 24  Temp:    SpO2: 96% 100%    Last Pain:  Vitals:   01/16/21 0400  TempSrc: Oral  PainSc:                  Shelton Silvas
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
## 2021-01-17 ENCOUNTER — Inpatient Hospital Stay (HOSPITAL_COMMUNITY): Payer: Medicaid Other

## 2021-01-17 ENCOUNTER — Encounter (HOSPITAL_COMMUNITY): Payer: Self-pay

## 2021-01-17 ENCOUNTER — Other Ambulatory Visit: Payer: Self-pay

## 2021-01-17 LAB — BASIC METABOLIC PANEL
Anion gap: 13 (ref 5–15)
BUN: <5 mg/dL — ABNORMAL LOW (ref 6–20)
CO2: 24 mmol/L (ref 22–32)
Calcium: 8 mg/dL — ABNORMAL LOW (ref 8.9–10.3)
Chloride: 94 mmol/L — ABNORMAL LOW (ref 98–111)
Creatinine, Ser: 0.73 mg/dL (ref 0.61–1.24)
GFR, Estimated: >60 mL/min (ref >60)
Glucose, Bld: 112 mg/dL — ABNORMAL HIGH (ref 70–99)
Potassium: 3.2 mmol/L — ABNORMAL LOW (ref 3.5–5.1)
Sodium: 131 mmol/L — ABNORMAL LOW (ref 135–145)

## 2021-01-17 LAB — CBC
HCT: 27.2 % — ABNORMAL LOW (ref 39.0–52.0)
Hemoglobin: 8.9 g/dL — ABNORMAL LOW (ref 13.0–17.0)
MCH: 26.6 pg (ref 26.0–34.0)
MCHC: 32.7 g/dL (ref 30.0–36.0)
MCV: 81.2 fL (ref 80.0–100.0)
Platelets: 387 10*3/uL (ref 150–400)
RBC: 3.35 MIL/uL — ABNORMAL LOW (ref 4.22–5.81)
RDW: 13.9 % (ref 11.5–15.5)
WBC: 14.9 10*3/uL — ABNORMAL HIGH (ref 4.0–10.5)
nRBC: 0.1 % (ref 0.0–0.2)

## 2021-01-17 LAB — MAGNESIUM: Magnesium: 1.7 mg/dL (ref 1.7–2.4)

## 2021-01-17 MED ORDER — POTASSIUM CHLORIDE IN NACL 20-0.9 MEQ/L-% IV SOLN
INTRAVENOUS | Status: DC
  Filled 2021-01-17 (×2): qty 1000

## 2021-01-17 MED ORDER — SODIUM CHLORIDE 0.9 % IV SOLN: INTRAVENOUS | Status: DC

## 2021-01-17 MED ORDER — HYDROCODONE-ACETAMINOPHEN 5-325 MG PO TABS
1.0000 | ORAL_TABLET | Freq: Four times a day (QID) | ORAL | Status: DC | PRN
  Administered 2021-01-17: 1 via ORAL
  Administered 2021-01-18 (×2): 2 via ORAL
  Administered 2021-01-18: 1 via ORAL
  Filled 2021-01-17: qty 2
  Filled 2021-01-17: qty 1
  Filled 2021-01-17: qty 2
  Filled 2021-01-17: qty 1

## 2021-01-17 MED ORDER — POTASSIUM CHLORIDE 20 MEQ PO PACK
40.0000 meq | PACK | Freq: Once | ORAL | Status: AC
  Administered 2021-01-17: 40 meq via ORAL
  Filled 2021-01-17: qty 2

## 2021-01-17 MED ORDER — ORAL CARE MOUTH RINSE
15.0000 mL | Freq: Two times a day (BID) | OROMUCOSAL | Status: DC
  Administered 2021-01-17 – 2021-01-18 (×3): 15 mL via OROMUCOSAL
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
## 2021-01-18 ENCOUNTER — Encounter (HOSPITAL_COMMUNITY): Payer: Self-pay

## 2021-01-18 LAB — BASIC METABOLIC PANEL
Anion gap: 11 (ref 5–15)
BUN: <5 mg/dL — ABNORMAL LOW (ref 6–20)
CO2: 27 mmol/L (ref 22–32)
Calcium: 8.2 mg/dL — ABNORMAL LOW (ref 8.9–10.3)
Chloride: 98 mmol/L (ref 98–111)
Creatinine, Ser: 0.63 mg/dL (ref 0.61–1.24)
GFR, Estimated: >60 mL/min (ref >60)
Glucose, Bld: 111 mg/dL — ABNORMAL HIGH (ref 70–99)
Potassium: 3.1 mmol/L — ABNORMAL LOW (ref 3.5–5.1)
Sodium: 136 mmol/L (ref 135–145)

## 2021-01-18 LAB — TYPE AND SCREEN
ABO/RH(D): A POS
Antibody Screen: NEGATIVE
Unit division: 0
Unit division: 0

## 2021-01-18 LAB — CBC
HCT: 27.6 % — ABNORMAL LOW (ref 39.0–52.0)
Hemoglobin: 9.1 g/dL — ABNORMAL LOW (ref 13.0–17.0)
MCH: 26.8 pg (ref 26.0–34.0)
MCHC: 33 g/dL (ref 30.0–36.0)
MCV: 81.4 fL (ref 80.0–100.0)
Platelets: 495 10*3/uL — ABNORMAL HIGH (ref 150–400)
RBC: 3.39 MIL/uL — ABNORMAL LOW (ref 4.22–5.81)
RDW: 14 % (ref 11.5–15.5)
WBC: 12.6 10*3/uL — ABNORMAL HIGH (ref 4.0–10.5)
nRBC: 0.3 % — ABNORMAL HIGH (ref 0.0–0.2)

## 2021-01-18 LAB — BPAM RBC
Blood Product Expiration Date: 202210022359
Blood Product Expiration Date: 202210022359
Unit Type and Rh: 6200
Unit Type and Rh: 6200

## 2021-01-18 MED ORDER — MENINGOCOCCAL A C Y&W-135 OLIG IM SOLR
0.5000 mL | Freq: Once | INTRAMUSCULAR | Status: DC
  Filled 2021-01-18: qty 0.5

## 2021-01-18 MED ORDER — PNEUMOCOCCAL 13-VAL CONJ VACC IM SUSP
0.5000 mL | Freq: Once | INTRAMUSCULAR | Status: DC
  Filled 2021-01-18: qty 0.5

## 2021-01-18 MED ORDER — HYDROCODONE-ACETAMINOPHEN 5-325 MG PO TABS: 1.0000 | ORAL_TABLET | Freq: Four times a day (QID) | ORAL | 0 refills | Status: AC | PRN

## 2021-01-18 MED ORDER — HAEMOPHILUS B POLYSAC CONJ VAC IM SOLR
0.5000 mL | Freq: Once | INTRAMUSCULAR | Status: AC
  Administered 2021-01-18: 0.5 mL via INTRAMUSCULAR
  Filled 2021-01-18: qty 0.5

## 2021-01-18 MED ORDER — PNEUMOCOCCAL 13-VAL CONJ VACC IM SUSP
0.5000 mL | Freq: Once | INTRAMUSCULAR | Status: AC
  Administered 2021-01-18: 0.5 mL via INTRAMUSCULAR
  Filled 2021-01-18 (×2): qty 0.5

## 2021-01-18 MED ORDER — GABAPENTIN 300 MG PO CAPS: 300.0000 mg | ORAL_CAPSULE | Freq: Two times a day (BID) | ORAL | 0 refills | Status: DC | PRN

## 2021-01-18 MED ORDER — HAEMOPHILUS B POLYSAC CONJ VAC IM SOLR
0.5000 mL | Freq: Once | INTRAMUSCULAR | Status: DC
  Filled 2021-01-18: qty 0.5

## 2021-01-18 MED ORDER — METHOCARBAMOL 500 MG PO TABS: 500.0000 mg | ORAL_TABLET | Freq: Three times a day (TID) | ORAL | 0 refills | Status: DC | PRN

## 2021-01-18 MED ORDER — MENINGOCOCCAL A C Y&W-135 OLIG IM SOLR
0.5000 mL | Freq: Once | INTRAMUSCULAR | Status: AC
  Administered 2021-01-18: 0.5 mL via INTRAMUSCULAR
  Filled 2021-01-18 (×2): qty 0.5

## 2021-01-18 MED ORDER — PNEUMOCOCCAL 13-VAL CONJ VACC IM SUSP
0.5000 mL | INTRAMUSCULAR | Status: DC | PRN
  Filled 2021-01-18: qty 0.5

## 2021-01-18 MED ORDER — MENINGOCOCCAL A C Y&W-135 OLIG IM SOLR
0.5000 mL | INTRAMUSCULAR | Status: DC | PRN
  Filled 2021-01-18: qty 0.5

## 2021-01-18 MED ORDER — HAEMOPHILUS B POLYSAC CONJ VAC IM SOLR
0.5000 mL | INTRAMUSCULAR | Status: DC | PRN
  Filled 2021-01-18: qty 0.5
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
## 2021-01-18 NOTE — Discharge Instructions (Addendum)
[facility]      [facility], [place] [phone]  OPEN ABDOMINAL SURGERY: POST OP INSTRUCTIONS  Always review your discharge instruction sheet given to you by the facility where your surgery was performed.  IF YOU HAVE DISABILITY OR FAMILY LEAVE FORMS, YOU MUST BRING THEM TO THE OFFICE FOR PROCESSING.  PLEASE DO NOT GIVE THEM TO YOUR DOCTOR.  A prescription for pain medication may be given to you upon discharge.  Take your pain medication as prescribed, if needed.  If narcotic pain medicine is not needed, then you may take acetaminophen (Tylenol) or ibuprofen (Advil) as needed. Take your usually prescribed medications unless otherwise directed. If you need a refill on your pain medication, please contact your pharmacy. They will contact our office to request authorization.  Prescriptions will not be filled after 5pm or on week-ends. You should follow a light diet the first few days after arrival home, such as soup and crackers, pudding, etc.unless your doctor has advised otherwise. A high-fiber, low fat diet can be resumed as tolerated.   Be sure to include lots of fluids daily. Most patients will experience some swelling and bruising on the chest and neck area.  Ice packs will help.  Swelling and bruising can take several days to resolve Most patients will experience some swelling and bruising in the area of the incision. Ice pack will help. Swelling and bruising can take several days to resolve..  It is common to experience some constipation if taking pain medication after surgery.  Increasing fluid intake and taking a stool softener will usually help or prevent this problem from occurring.  A mild laxative (Milk of Magnesia or Miralax) should be taken according to package directions if there are no bowel movements after 48 hours.  You may have steri-strips (small skin tapes) in place directly over the incision.  These strips should be left on the skin for 7-10 days.  If your surgeon used skin  glue on the incision, you may shower in 24 hours.  The glue will flake off over the next 2-3 weeks.  Any sutures or staples will be removed at the office during your follow-up visit. You may find that a light gauze bandage over your incision may keep your staples from being rubbed or pulled. You may shower and replace the bandage daily. ACTIVITIES:  You may resume regular (light) daily activities beginning the next day--such as daily self-care, walking, climbing stairs--gradually increasing activities as tolerated.  You may have sexual intercourse when it is comfortable.  Refrain from any heavy lifting or straining until approved by your doctor. You may drive when you no longer are taking prescription pain medication, you can comfortably wear a seatbelt, and you can safely maneuver your car and apply brakes You should see your doctor in the office for a follow-up appointment approximately two weeks after your surgery.  Make sure that you call for this appointment within a day or two after you arrive home to insure a convenient appointment time.  WHEN TO CALL YOUR DOCTOR: Fever over 101.0 Inability to urinate Nausea and/or vomiting Extreme swelling or bruising Continued bleeding from incision. Increased pain, redness, or drainage from the incision. Difficulty swallowing or breathing Muscle cramping or spasms. Numbness or tingling in hands or feet or around lips.  The clinic staff is available to answer your questions during regular business hours.  Please don't hesitate to call and ask to speak to one of the nurses if you have concerns.  For further  questions, please visit [email or web address]   RIB FRACTURES  HOME INSTRUCTIONS   PAIN CONTROL:  Pain is best controlled by a usual combination of three different methods TOGETHER:  Ice/Heat Over the counter pain medication Prescription pain medication You may experience some swelling and bruising in area of broken ribs. Ice packs  or heating pads (30-60 minutes up to 6 times a day) will help. Use ice for the first few days to help decrease swelling and bruising, then switch to heat to help relax tight/sore spots and speed recovery. Some people prefer to use ice alone, heat alone, alternating between ice & heat. Experiment to what works for you. Swelling and bruising can take several weeks to resolve.  It is helpful to take an over-the-counter pain medication regularly for the first few weeks. Choose one of the following that works best for you:  Naproxen (Aleve, etc) Two 220mg  tabs twice a day Ibuprofen (Advil, etc) Three 200mg  tabs four times a day (every meal & bedtime) Acetaminophen (Tylenol, etc) 500-650mg  four times a day (every meal & bedtime) A prescription for pain medication (such as oxycodone, hydrocodone, etc) may be given to you upon discharge. Take your pain medication as prescribed.  If you are having problems/concerns with the prescription medicine (does not control pain, nausea, vomiting, rash, itching, etc), please call [phone] to see if we need to switch you to a different pain medicine that will work better for you and/or control your side effect better. If you need a refill on your pain medication, please contact your pharmacy. They will contact our office to request authorization. Prescriptions will not be filled after 5 pm or on week-ends. Avoid getting constipated. When taking pain medications, it is common to experience some constipation. Increasing fluid intake and taking a fiber supplement (such as Metamucil, Citrucel, FiberCon, MiraLax, etc) 1-2 times a day regularly will usually help prevent this problem from occurring. A mild laxative (prune juice, Milk of Magnesia, MiraLax, etc) should be taken according to package directions if there are no bowel movements after 48 hours.  Watch out for diarrhea. If you have many loose bowel movements, simplify your diet to bland foods & liquids for a few  days. Stop any stool softeners and decrease your fiber supplement. Switching to mild anti-diarrheal medications (Kayopectate, Pepto Bismol) can help. If this worsens or does not improve, please call us. FOLLOW UP  If a follow up appointment is needed one will be scheduled for you. If none is needed with our trauma team, please follow up with your primary care provider within 2-3 weeks from discharge. Please call [facility] at [phone] if you have any questions about follow up.  If you have any orthopedic or other injuries you will need to follow up as outlined in your follow up instructions.   WHEN TO CALL us [phone]:  Poor pain control Reactions / problems with new medications (rash/itching, nausea, etc)  Fever over 101.5 F (38.5 C) Worsening swelling or bruising Worsening pain, productive cough, difficulty breathing or any other concerning symptoms  The clinic staff is available to answer your questions during regular business hours (8:30am-5pm). Please don't hesitate to call and ask to speak to one of our nurses for clinical concerns.  If you have a medical emergency, go to the nearest emergency room or call 911.  A surgeon from [facility] is always on call at the [facility], [facility]  [address],  [place], [place] [address] ?  MAIN: [phone] ? TOLL FREE: [phone] ?  FAX [phone]  [email or web address]      Information on Rib Fractures  A rib fracture is a break or crack in one of the bones of the ribs. The ribs are long, curved bones that wrap around your chest and attach to your spine and your breastbone. The ribs protect your heart, lungs, and other organs in the chest. A broken or cracked rib is often painful but is not usually serious. Most rib fractures heal on their own over time. However, rib fractures can be more serious if multiple ribs are broken or if broken ribs move out of place and push  against other structures or organs. What are the causes? This condition is caused by: Repetitive movements with high force, such as pitching a baseball or having severe coughing spells. A direct blow to the chest, such as a sports injury, a car accident, or a fall. Cancer that has spread to the bones, which can weaken bones and cause them to break. What are the signs or symptoms? Symptoms of this condition include: Pain when you breathe in or cough. Pain when someone presses on the injured area. Feeling short of breath. How is this diagnosed? This condition is diagnosed with a physical exam and medical history. Imaging tests may also be done, such as: Chest X-ray. CT scan. MRI. Bone scan. Chest ultrasound. How is this treated? Treatment for this condition depends on the severity of the fracture. Most rib fractures usually heal on their own in 1-3 months. Sometimes healing takes longer if there is a cough that does not stop or if there are other activities that make the injury worse (aggravating factors). While you heal, you will be given medicines to control the pain. You will also be taught deep breathing exercises. Severe injuries may require hospitalization or surgery. Follow these instructions at home: Managing pain, stiffness, and swelling If directed, apply ice to the injured area. Put ice in a plastic bag. Place a towel between your skin and the bag. Leave the ice on for 20 minutes, 2-3 times a day. Take over-the-counter and prescription medicines only as told by your health care provider. Activity Avoid a lot of activity and any activities or movements that cause pain. Be careful during activities and avoid bumping the injured rib. Slowly increase your activity as told by your health care provider. General instructions Do deep breathing exercises as told by your health care provider. This helps prevent pneumonia, which is a common complication of a broken rib. Your health care  provider may instruct you to: Take deep breaths several times a day. Try to cough several times a day, holding a pillow against the injured area. Use a device called incentive spirometer to practice deep breathing several times a day. Drink enough fluid to keep your urine pale yellow. Do not wear a rib belt or binder. These restrict breathing, which can lead to pneumonia. Keep all follow-up visits as told by your health care provider. This is important. Contact a health care provider if: You have a fever. Get help right away if: You have difficulty breathing or you are short of breath. You develop a cough that does not stop, or you cough up thick or bloody sputum. You have nausea, vomiting, or pain in your abdomen. Your pain gets worse and medicine does not help. Summary A rib fracture is a break or crack in one of the bones of the  ribs. A broken or cracked rib is often painful but is not usually serious. Most rib fractures heal on their own over time. Treatment for this condition depends on the severity of the fracture. Avoid a lot of activity and any activities or movements that cause pain. This information is not intended to replace advice given to you by your health care provider. Make sure you discuss any questions you have with your health care provider. Document Released: 04/07/2005 Document Revised: 07/07/2016 Document Reviewed: 07/07/2016 Elsevier Interactive Patient Education  2019 [employer].

## 2021-01-18 NOTE — Progress Notes (Signed)
Elbert Ewings to be D/C'd Home per MD order.  Discussed with the patient and all questions fully answered.  VSS, Skin clean, dry and intact without evidence of skin break down, no evidence of skin tears noted. IV catheter discontinued intact. Site without signs and symptoms of complications. Dressing and pressure applied.  An After Visit Summary was printed and given to the patient. Patient received prescription.  D/c education completed with patient/family including follow up instructions, medication list, d/c activities limitations if indicated, with other d/c instructions as indicated by MD - patient able to verbalize understanding, all questions fully answered.   Patient instructed to return to ED, call 911, or call MD for any changes in condition.   Patient escorted via WC, and D/C home via private auto.    Conley Canal [facility] 01/18/2021 2:51 PM

## 2021-01-25 ENCOUNTER — Inpatient Hospital Stay: Payer: Medicaid Other | Admitting: Family

## 2021-01-31 ENCOUNTER — Ambulatory Visit (INDEPENDENT_AMBULATORY_CARE_PROVIDER_SITE_OTHER): Payer: Medicaid Other | Admitting: Nurse Practitioner

## 2021-01-31 ENCOUNTER — Other Ambulatory Visit: Payer: Self-pay

## 2021-01-31 VITALS — BP 128/80 | HR 94 | Resp 18

## 2021-01-31 DIAGNOSIS — W3400XA Accidental discharge from unspecified firearms or gun, initial encounter: Secondary | ICD-10-CM | POA: Diagnosis not present

## 2021-01-31 DIAGNOSIS — S2249XD Multiple fractures of ribs, unspecified side, subsequent encounter for fracture with routine healing: Secondary | ICD-10-CM | POA: Diagnosis not present

## 2021-01-31 DIAGNOSIS — Z9889 Other specified postprocedural states: Secondary | ICD-10-CM

## 2021-01-31 MED ORDER — GABAPENTIN 300 MG PO CAPS: 300.0000 mg | ORAL_CAPSULE | Freq: Two times a day (BID) | ORAL | 0 refills | Status: AC | PRN

## 2021-01-31 MED ORDER — METHOCARBAMOL 500 MG PO TABS: 500.0000 mg | ORAL_TABLET | Freq: Three times a day (TID) | ORAL | 0 refills | Status: AC | PRN
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
## 2021-02-06 ENCOUNTER — Encounter: Payer: Self-pay | Admitting: Nurse Practitioner

## 2021-02-06 DIAGNOSIS — S2249XD Multiple fractures of ribs, unspecified side, subsequent encounter for fracture with routine healing: Secondary | ICD-10-CM | POA: Insufficient documentation
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
## 2021-02-18 ENCOUNTER — Ambulatory Visit: Payer: Medicaid Other | Admitting: Family Medicine

## 2021-10-29 ENCOUNTER — Emergency Department (HOSPITAL_BASED_OUTPATIENT_CLINIC_OR_DEPARTMENT_OTHER)
Admission: EM | Admit: 2021-10-29 | Discharge: 2021-10-29 | Disposition: A | Discharge status: Home / Self Care | Payer: Medicaid Other | Attending: Emergency Medicine | Admitting: Emergency Medicine

## 2021-10-29 ENCOUNTER — Encounter (HOSPITAL_BASED_OUTPATIENT_CLINIC_OR_DEPARTMENT_OTHER): Payer: Self-pay | Admitting: Emergency Medicine

## 2021-10-29 ENCOUNTER — Other Ambulatory Visit: Payer: Self-pay

## 2021-10-29 DIAGNOSIS — R109 Unspecified abdominal pain: Secondary | ICD-10-CM | POA: Insufficient documentation

## 2021-10-29 DIAGNOSIS — R112 Nausea with vomiting, unspecified: Secondary | ICD-10-CM | POA: Diagnosis present

## 2021-10-29 HISTORY — DX: Accidental discharge from unspecified firearms or gun, initial encounter: W34.00XA

## 2021-10-29 LAB — COMPREHENSIVE METABOLIC PANEL
ALT: 48 U/L — ABNORMAL HIGH (ref 0–44)
AST: 26 U/L (ref 15–41)
Albumin: 4.1 g/dL (ref 3.5–5.0)
Alkaline Phosphatase: 117 U/L (ref 38–126)
Anion gap: 11 (ref 5–15)
BUN: 11 mg/dL (ref 6–20)
CO2: 23 mmol/L (ref 22–32)
Calcium: 8.9 mg/dL (ref 8.9–10.3)
Chloride: 107 mmol/L (ref 98–111)
Creatinine, Ser: 0.74 mg/dL (ref 0.61–1.24)
GFR, Estimated: >60 mL/min (ref >60)
Glucose, Bld: 112 mg/dL — ABNORMAL HIGH (ref 70–99)
Potassium: 4.1 mmol/L (ref 3.5–5.1)
Sodium: 141 mmol/L (ref 135–145)
Total Bilirubin: 0.5 mg/dL (ref 0.3–1.2)
Total Protein: 7.9 g/dL (ref 6.5–8.1)

## 2021-10-29 LAB — CBC WITH DIFFERENTIAL/PLATELET
Abs Immature Granulocytes: 0.05 10*3/uL (ref 0.00–0.07)
Basophils Absolute: 0 10*3/uL (ref 0.0–0.1)
Basophils Relative: 0 %
Eosinophils Absolute: 0 10*3/uL (ref 0.0–0.5)
Eosinophils Relative: 0 %
HCT: 44 % (ref 39.0–52.0)
Hemoglobin: 14.6 g/dL (ref 13.0–17.0)
Immature Granulocytes: 0 %
Lymphocytes Relative: 24 %
Lymphs Abs: 3.3 10*3/uL (ref 0.7–4.0)
MCH: 25.4 pg — ABNORMAL LOW (ref 26.0–34.0)
MCHC: 33.2 g/dL (ref 30.0–36.0)
MCV: 76.5 fL — ABNORMAL LOW (ref 80.0–100.0)
Monocytes Absolute: 0.6 10*3/uL (ref 0.1–1.0)
Monocytes Relative: 5 %
Neutro Abs: 9.8 10*3/uL — ABNORMAL HIGH (ref 1.7–7.7)
Neutrophils Relative %: 71 %
Platelets: 576 10*3/uL — ABNORMAL HIGH (ref 150–400)
RBC: 5.75 MIL/uL (ref 4.22–5.81)
RDW: 15.4 % (ref 11.5–15.5)
WBC: 13.8 10*3/uL — ABNORMAL HIGH (ref 4.0–10.5)
nRBC: 0 % (ref 0.0–0.2)

## 2021-10-29 LAB — LIPASE, BLOOD: Lipase: 22 U/L (ref 11–51)

## 2021-10-29 LAB — ETHANOL: Alcohol, Ethyl (B): <10 mg/dL (ref <10)

## 2021-10-29 MED ORDER — ONDANSETRON 4 MG PO TBDP: ORAL_TABLET | ORAL | 0 refills | Status: AC

## 2021-10-29 MED ORDER — DROPERIDOL 2.5 MG/ML IJ SOLN
2.5000 mg | Freq: Once | INTRAMUSCULAR | Status: AC
  Administered 2021-10-29: 2.5 mg via INTRAVENOUS
  Filled 2021-10-29: qty 2

## 2021-10-29 MED ORDER — LACTATED RINGERS IV BOLUS
1000.0000 mL | Freq: Once | INTRAVENOUS | Status: AC
  Administered 2021-10-29: 1000 mL via INTRAVENOUS
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
## 2021-10-29 NOTE — ED Triage Notes (Signed)
Pt with abd and vomiting x 3-4 hours. Denies diarrhea.

## 2022-07-26 ENCOUNTER — Other Ambulatory Visit: Payer: Self-pay

## 2022-07-26 ENCOUNTER — Encounter (HOSPITAL_BASED_OUTPATIENT_CLINIC_OR_DEPARTMENT_OTHER): Payer: Self-pay | Admitting: Pediatrics

## 2022-07-26 ENCOUNTER — Emergency Department (HOSPITAL_BASED_OUTPATIENT_CLINIC_OR_DEPARTMENT_OTHER)
Admission: EM | Admit: 2022-07-26 | Discharge: 2022-07-26 | Disposition: A | Discharge status: Home / Self Care | Payer: Medicaid Other | Attending: Emergency Medicine | Admitting: Emergency Medicine

## 2022-07-26 DIAGNOSIS — Z1152 Encounter for screening for COVID-19: Secondary | ICD-10-CM | POA: Insufficient documentation

## 2022-07-26 DIAGNOSIS — J020 Streptococcal pharyngitis: Secondary | ICD-10-CM

## 2022-07-26 LAB — SARS CORONAVIRUS 2 BY RT PCR: SARS Coronavirus 2 by RT PCR: NEGATIVE

## 2022-07-26 LAB — GROUP A STREP BY PCR: Group A Strep by PCR: DETECTED — AB

## 2022-07-26 MED ORDER — DEXAMETHASONE 4 MG PO TABS
10.0000 mg | ORAL_TABLET | Freq: Once | ORAL | Status: AC
  Administered 2022-07-26: 10 mg via ORAL
  Filled 2022-07-26: qty 3

## 2022-07-26 MED ORDER — AMOXICILLIN 500 MG PO CAPS: 1000.0000 mg | ORAL_CAPSULE | Freq: Every day | ORAL | 0 refills | Status: AC

## 2022-07-26 MED ORDER — AMOXICILLIN 500 MG PO CAPS
1000.0000 mg | ORAL_CAPSULE | Freq: Once | ORAL | Status: AC
Start: 2022-07-26 — End: 2022-07-26
  Administered 2022-07-26: 1000 mg via ORAL
  Filled 2022-07-26: qty 2

## 2022-07-26 NOTE — ED Triage Notes (Signed)
C/o sore throat x 3 days along with some congestion; denies fever. Reports hx of seasonal allergy.

## 2022-07-26 NOTE — ED Provider Notes (Signed)
[place] [facility] Provider Note   CSN: [id number] Arrival date & time: 07/26/22  [id number]     History  Chief Complaint  Patient presents with   Sore Throat    Mark Grimes is a 22 y.o. male.  HPI 22 year old male presents with sore throat for about 3 days.  He does not think he had a fever though has had some chills.  He has been taking Benadryl and ibuprofen.  Today he was choking on some water and having a hard time swallowing it (though he was able to swallow) and so he took a picture of his throat, told his mom, and she advised him to come to the ER.  Patient feels like it is very painful to swallow though he cannot swallow.  He denies any trouble breathing.  No ear pain.  Home Medications Prior to Admission medications   Medication Sig Start Date End Date Taking? Authorizing Provider  gabapentin (NEURONTIN) 300 MG capsule Take 1 capsule (300 mg total) by mouth 2 (two) times daily as needed. 01/31/21   Ivonne AndrewNichols, Tonya S, NP  HYDROcodone-acetaminophen (NORCO/VICODIN) 5-325 MG tablet Take 1-2 tablets by mouth every 6 (six) hours as needed for moderate pain. 01/18/21   Barnetta Chapelsborne, Kelly, PA-C  methocarbamol (ROBAXIN) 500 MG tablet Take 1 tablet (500 mg total) by mouth every 8 (eight) hours as needed for muscle spasms. 01/31/21   Ivonne AndrewNichols, Tonya S, NP  ondansetron (ZOFRAN-ODT) 4 MG disintegrating tablet 4mg  ODT q4 hours prn nausea/vomit 10/29/21   Mesner, Barbara CowerJason, MD      Allergies    Prednisone    Review of Systems   Review of Systems  Constitutional:  Negative for fever.  HENT:  Positive for sore throat and trouble swallowing. Negative for ear pain.   Respiratory:  Negative for shortness of breath.   Gastrointestinal:  Negative for vomiting.    Physical Exam Updated Vital Signs BP 137/84 (BP Location: Left Arm)   Pulse 93   Temp 98.1 F (36.7 C) (Oral)   Resp 18   Ht 6\' 3"  (1.905 m)   Wt 87.1 kg   SpO2 100%   BMI 24.00 kg/m  Physical  Exam Vitals and nursing note reviewed.  Constitutional:      General: He is not in acute distress.    Appearance: He is well-developed. He is not ill-appearing or diaphoretic.  HENT:     Head: Normocephalic and atraumatic.     Mouth/Throat:     Pharynx: Uvula midline. No uvula swelling.     Tonsils: Tonsillar exudate present. No tonsillar abscesses. 4+ on the right. 4+ on the left.     Comments: No trismus.  Cardiovascular:     Rate and Rhythm: Normal rate and regular rhythm.     Heart sounds: Normal heart sounds.  Pulmonary:     Effort: Pulmonary effort is normal.     Breath sounds: Normal breath sounds.  Skin:    General: Skin is warm and dry.  Neurological:     Mental Status: He is alert.     ED Results / Procedures / Treatments   Labs (all labs ordered are listed, but only abnormal results are displayed) Labs Reviewed  GROUP A STREP BY PCR - Abnormal; Notable for the following components:      Result Value   Group A Strep by PCR DETECTED (*)    All other components within normal limits  SARS CORONAVIRUS 2 BY RT PCR  EKG None  Radiology No results found.  Procedures Procedures    Medications Ordered in ED Medications  dexamethasone (DECADRON) tablet 10 mg (has no administration in time range)  amoxicillin (AMOXIL) capsule 1,000 mg (has no administration in time range)    ED Course/ Medical Decision Making/ A&P                             Medical Decision Making Risk Prescription drug management.   Patient is well-appearing.  His strep test is positive.  A COVID test have been obtained prior to me seeing him but is negative.  He does have very enlarged tonsils but no evidence of a tonsillar abscess on my exam.  He is swallowing without significant difficulty here and was able to tolerate oral medicines.  Offered observation in the emergency department though he feels well enough and would like to go home.  Will treat with 10 days of antibiotics.  Will  refer to ENT in case not improving but otherwise will give return precautions.  Low concern for impending airway compromise or deep space infection.        Final Clinical Impression(s) / ED Diagnoses Final diagnoses:  Strep pharyngitis    Rx / DC Orders ED Discharge Orders     None         Pricilla Loveless, MD 07/26/22 [phone]

## 2022-07-26 NOTE — Discharge Instructions (Addendum)
Your strep test today is positive, indicating strep throat.  You were given a dose of steroids today called dexamethasone that will help reduce the swelling over the next few days.  You are also given your first dose of the antibiotic, amoxicillin.  Start the antibiotic prescription tomorrow, 4/7.  You are being referred to an ENT to follow-up with if your symptoms do not improve.  However if your symptoms worsen, you develop high fever, vomiting, trouble breathing or inability to swallow, or any other new/concerning symptoms then return to the ER or call 911.
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
## 2023-07-05 IMAGING — DX DG CHEST 1V PORT
1 series · 1 of 1 positions shown · non-contrast
Comparison: 01/15/2021

CLINICAL DATA: Left pleural effusion

EXAM:
PORTABLE CHEST 1 VIEW

[chest ap]
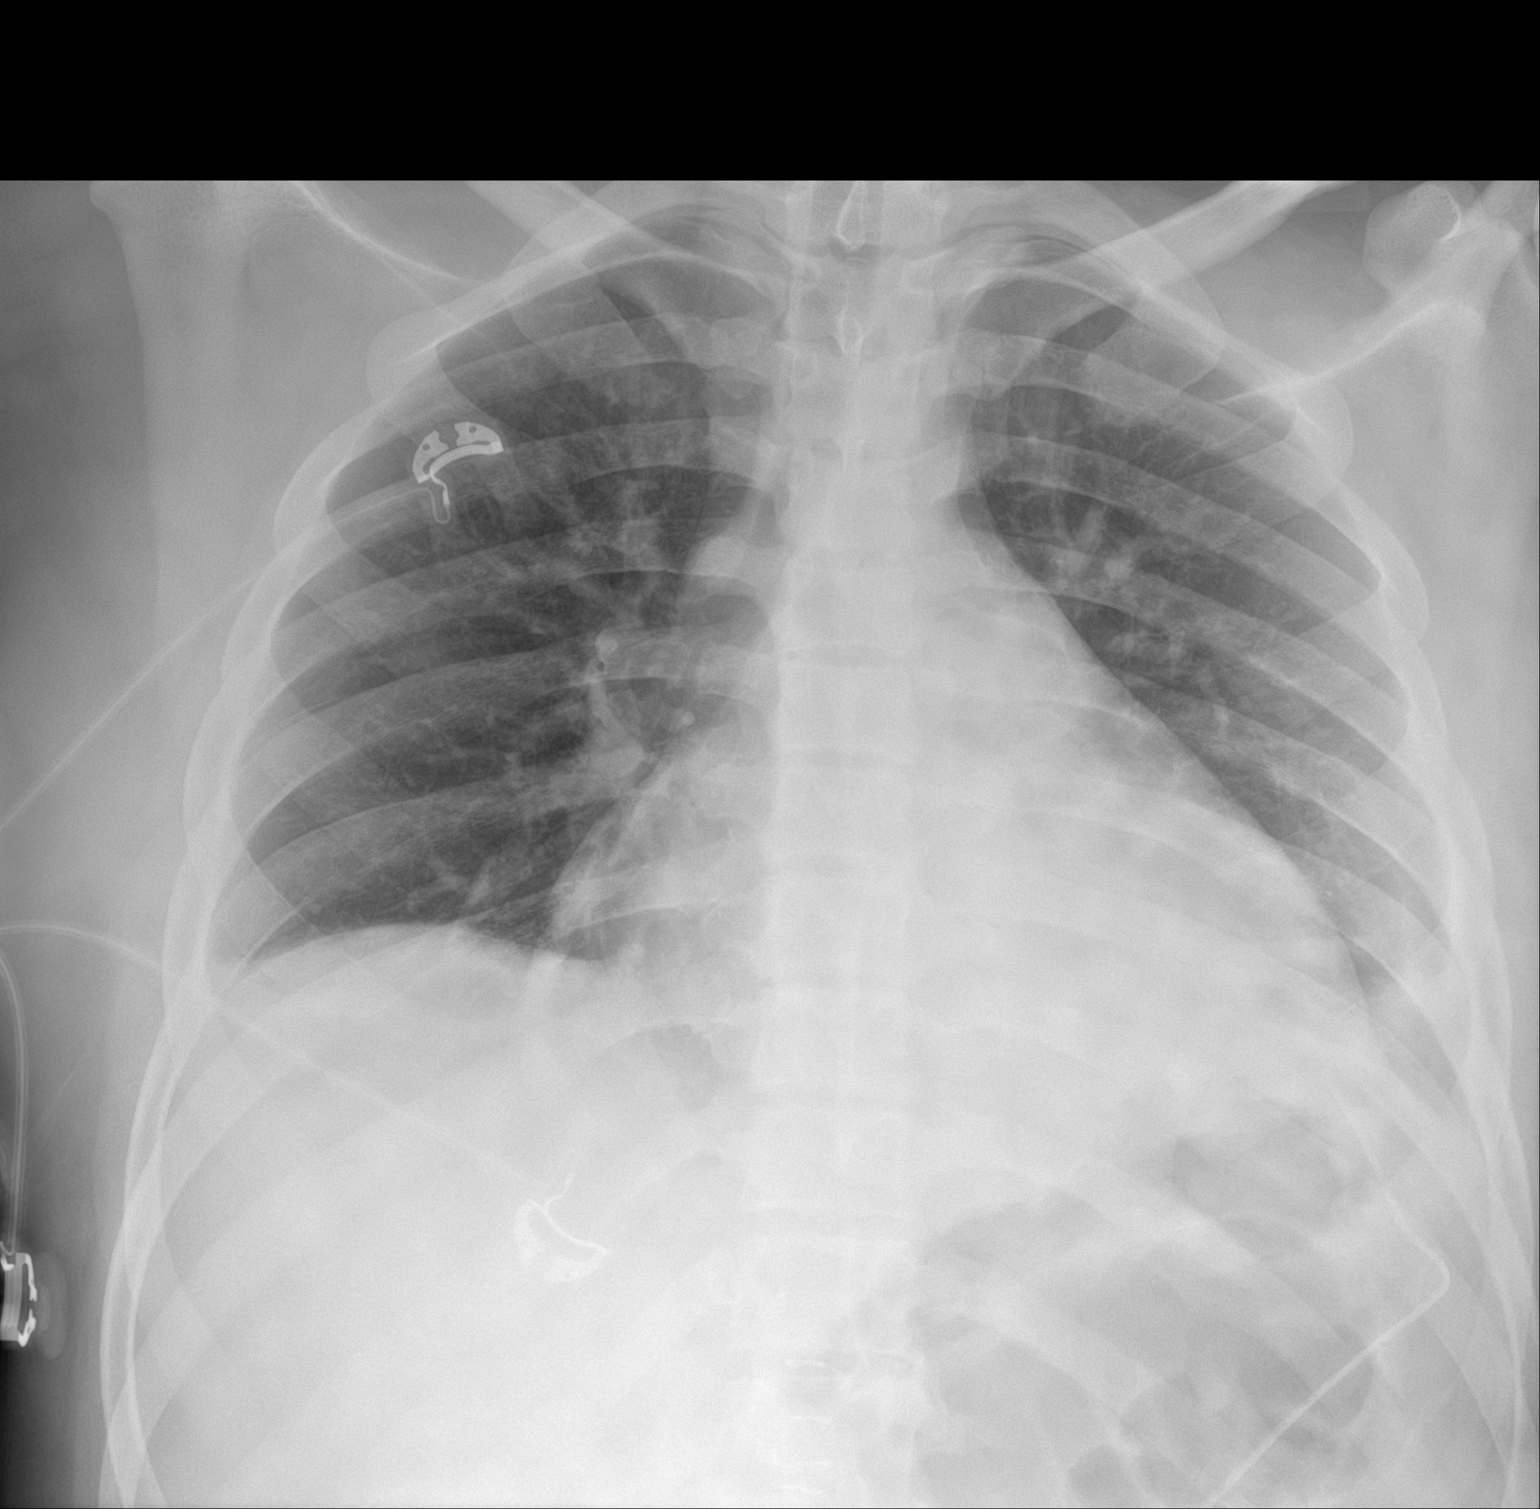

[1 of 1 positions shown; findings below may reference images not displayed]

FINDINGS: Cardiomegaly. Interval removal of endotracheal tube and NG tube.
Left upper quadrant drainage catheter is again noted. Left basilar
opacity could reflect atelectasis or contusion. No definite visible
significant effusion or pneumothorax.
IMPRESSION: Left lower lobe atelectasis or contusion. No visible left effusion
or pneumothorax.
